# Patient Record
Sex: Female | Born: 1975 | Hispanic: Yes | Marital: Single | Smoking: Current some day smoker
Health system: Southern US, Community
[De-identification: ages and names within clinical notes are randomized; demographics above are authoritative.]

## PROBLEM LIST (undated history)

## (undated) DIAGNOSIS — E119 Type 2 diabetes mellitus without complications: Secondary | ICD-10-CM

---

## 2018-06-26 ENCOUNTER — Encounter (HOSPITAL_COMMUNITY): Payer: Self-pay | Admitting: *Deleted

## 2018-07-22 ENCOUNTER — Other Ambulatory Visit (HOSPITAL_COMMUNITY): Payer: Self-pay | Admitting: *Deleted

## 2018-07-22 DIAGNOSIS — Z1231 Encounter for screening mammogram for malignant neoplasm of breast: Secondary | ICD-10-CM

## 2018-10-09 ENCOUNTER — Ambulatory Visit: Payer: Self-pay

## 2018-10-09 ENCOUNTER — Ambulatory Visit (HOSPITAL_COMMUNITY)
Admission: RE | Admit: 2018-10-09 | Discharge: 2018-10-09 | Disposition: A | Discharge status: Home / Self Care | Payer: Self-pay | Source: Ambulatory Visit | Attending: Obstetrics and Gynecology | Admitting: Obstetrics and Gynecology

## 2018-10-09 ENCOUNTER — Encounter (HOSPITAL_COMMUNITY): Payer: Self-pay

## 2018-10-09 ENCOUNTER — Other Ambulatory Visit (HOSPITAL_COMMUNITY): Payer: Self-pay | Admitting: Obstetrics and Gynecology

## 2018-10-09 VITALS — BP 110/70 | Ht 60.0 in | Wt 146.0 lb

## 2018-10-09 DIAGNOSIS — N6311 Unspecified lump in the right breast, upper outer quadrant: Secondary | ICD-10-CM

## 2018-10-09 DIAGNOSIS — N6320 Unspecified lump in the left breast, unspecified quadrant: Secondary | ICD-10-CM

## 2018-10-09 DIAGNOSIS — Z1239 Encounter for other screening for malignant neoplasm of breast: Secondary | ICD-10-CM

## 2018-10-09 DIAGNOSIS — N6325 Unspecified lump in the left breast, overlapping quadrants: Secondary | ICD-10-CM

## 2018-10-09 DIAGNOSIS — N6321 Unspecified lump in the left breast, upper outer quadrant: Secondary | ICD-10-CM

## 2018-10-09 DIAGNOSIS — N63 Unspecified lump in unspecified breast: Secondary | ICD-10-CM

## 2018-10-09 HISTORY — DX: Type 2 diabetes mellitus without complications: E11.9

## 2018-10-09 NOTE — Patient Instructions (Signed)
Explained breast self awareness with Mal Misty. Patient did not need a Pap smear today due to last Pap smear was 06/21/2018 per patient. Let her know BCCCP will cover Pap smears every 3 years unless has a history of abnormal Pap smears. Referred patient to the [facility] Center of [facility] for a diagnostic mammogram and possible bilateral breast ultrasounds. Appointment scheduled for Tuesday, October 14, 2018 at 1510. Patient aware of appointment and will be there. Let patient know the Breast Center will follow up with her within the next couple weeks with results of mammogram by letter or phone. Discussed smoking cessation with patient. Referred to the [facility] Quitline and gave resources to the free smoking cessation classes at [facility].Mal Misty verbalized understanding.  [place], Kathaleen Maser, RN 2:42 PM

## 2018-10-09 NOTE — Progress Notes (Signed)
No complaints today.   Pap Smear: Pap smear not completed today. Last Pap smear was 06/21/2018 at the [facility] and normal per patient. Per patient has no history of an abnormal Pap smear. No Pap smear results are in Epic.  Physical exam: Breasts Breasts symmetrical. No skin abnormalities bilateral breasts. No nipple retraction bilateral breasts. No nipple discharge bilateral breasts. No lymphadenopathy. Palpated a pea sized lump within the right breast at 11 o'clock 11 cm from the nipple. Palpated two lumps within the left breast at 1 o'clock 8 cm from the nipple and a mobile lump at 3 o'clock 4 cm from the nipple. No complaints of pain or tenderness on exam. Referred patient to the [facility] of [facility] for a diagnostic mammogram and possible bilateral breast ultrasounds. Appointment scheduled for Tuesday, October 14, 2018 at 1510.        Pelvic/Bimanual No Pap smear completed today since last Pap smear was 06/21/2018 per patient. Pap smear not indicated per BCCCP guidelines.   Smoking History: Patient is a current smoker. Discussed smoking cessation with patient. Referred to the [facility] Quitline and gave resources to the free smoking cessation classes at [facility].  Patient Navigation: Patient education provided. Access to services provided for patient through [facility] program. Spanish interpreter provided.   Breast and Cervical Cancer Risk Assessment: Patient has a family history of her mother having breast cancer. Patient has no known genetic mutations or history of radiation treatment to the chest before age 65. Patient has no history of cervical dysplasia, immunocompromised, or DES exposure in-utero.   Risk Assessment    Risk Scores      10/09/2018   Last edited by: Priscille Heidelberg, RN   5-year risk: 0.8 %   Lifetime risk: 11.6 %         Used Spanish interpreter Natale Lay from [place].

## 2018-10-14 ENCOUNTER — Ambulatory Visit
Admission: RE | Admit: 2018-10-14 | Discharge: 2018-10-14 | Disposition: A | Discharge status: Home / Self Care | Payer: PRIVATE HEALTH INSURANCE | Source: Ambulatory Visit | Attending: Obstetrics and Gynecology | Admitting: Obstetrics and Gynecology

## 2018-10-14 ENCOUNTER — Ambulatory Visit
Admission: RE | Admit: 2018-10-14 | Discharge: 2018-10-14 | Disposition: A | Discharge status: Home / Self Care | Payer: Self-pay | Source: Ambulatory Visit | Attending: Obstetrics and Gynecology | Admitting: Obstetrics and Gynecology

## 2018-10-14 DIAGNOSIS — N63 Unspecified lump in unspecified breast: Secondary | ICD-10-CM

## 2018-11-11 ENCOUNTER — Encounter (HOSPITAL_COMMUNITY): Payer: Self-pay | Admitting: *Deleted

## 2020-01-07 IMAGING — MG DIGITAL DIAGNOSTIC BILATERAL MAMMOGRAM WITH TOMO AND CAD
6 of 10 series · 6 of 30 positions shown · non-contrast
Comparison: None.

CLINICAL DATA: 42-year-old female presenting for baseline mammogram
to evaluate bilateral palpable lumps identified on clinical breast
exam. The patient has family history of breast cancer in her mother.

EXAM:
DIGITAL DIAGNOSTIC BILATERAL MAMMOGRAM WITH CAD AND TOMO
ULTRASOUND BILATERAL BREAST

[R CC synth-2D]
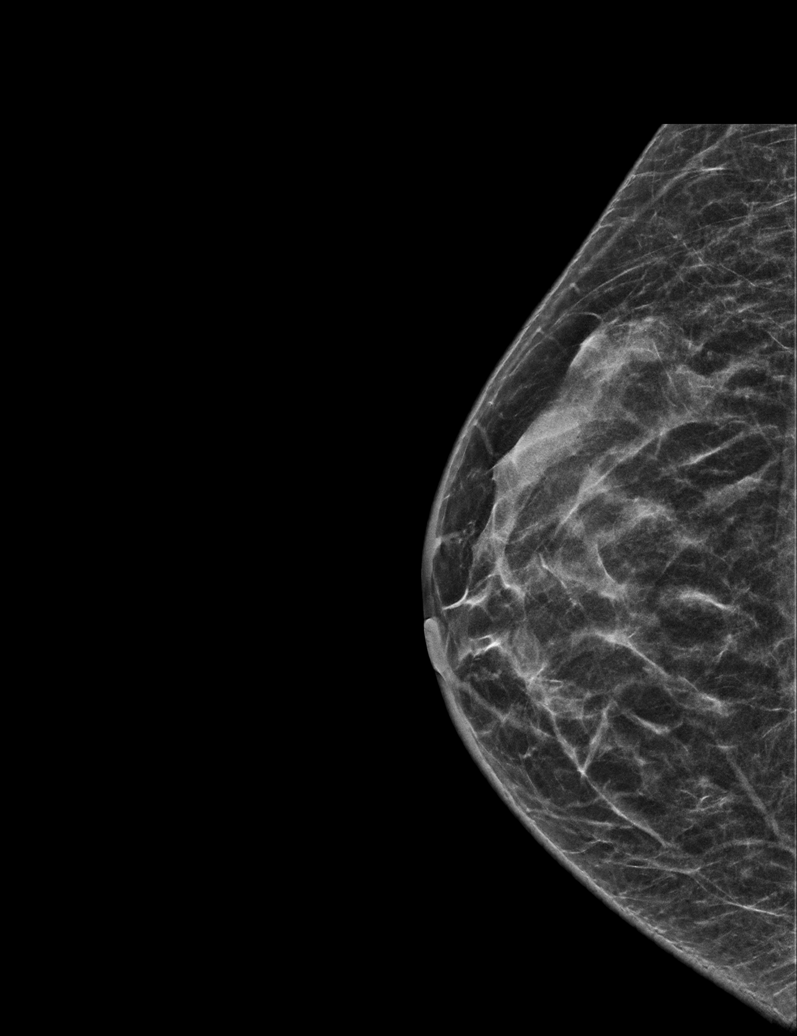

[R MLO synth-2D]
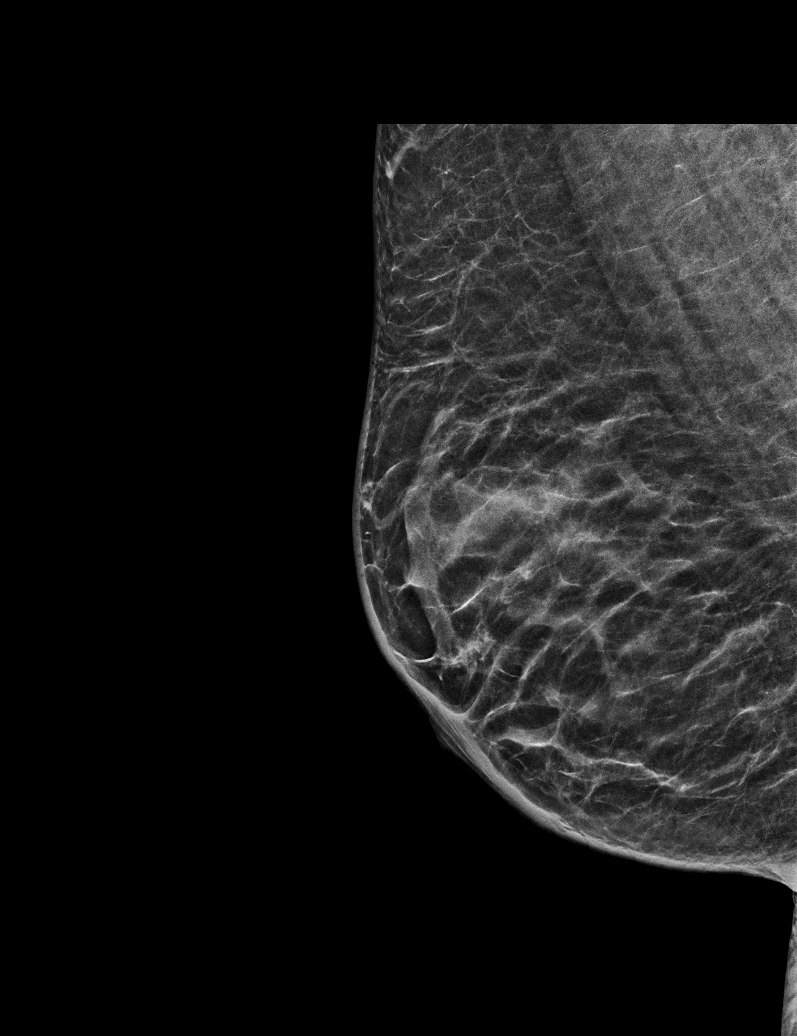

[L MLO synth-2D (1 of 2)]
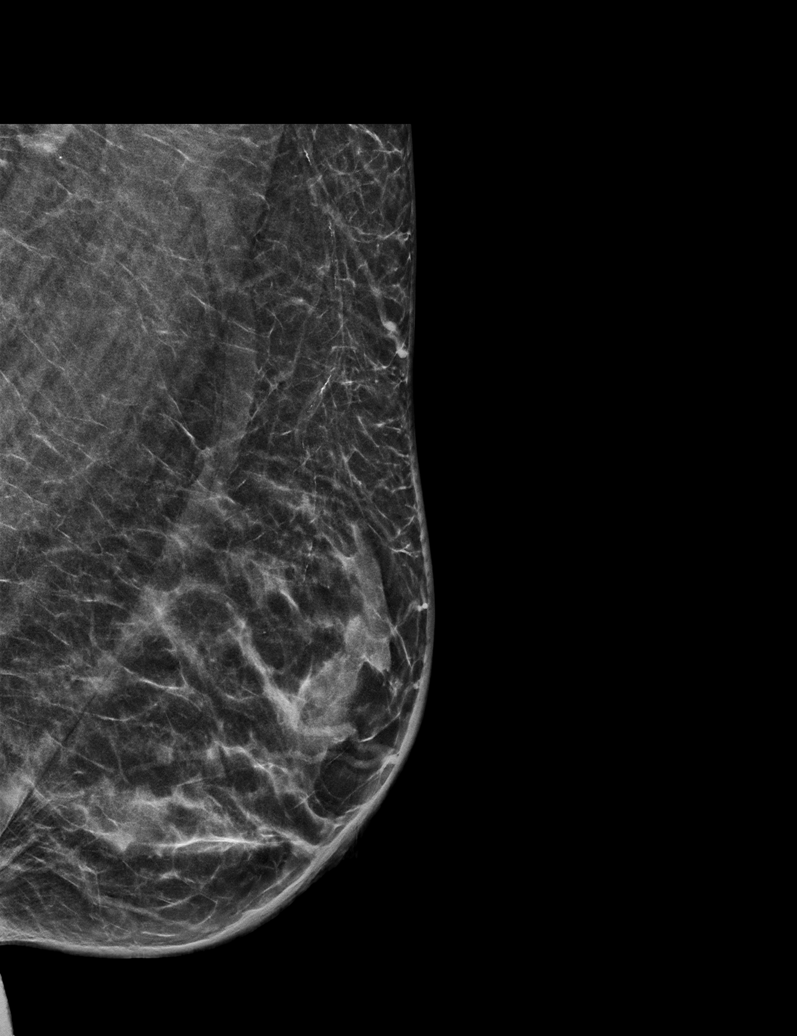

[L CC synth-2D]
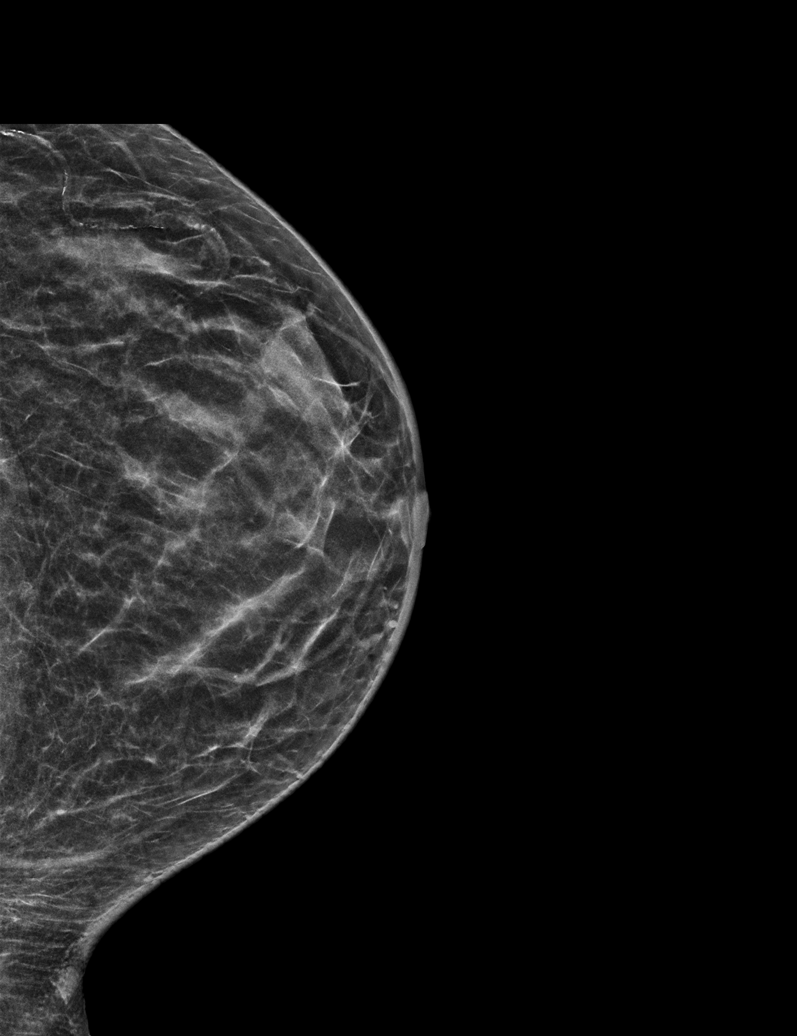

[L MLO synth-2D (2 of 2)]
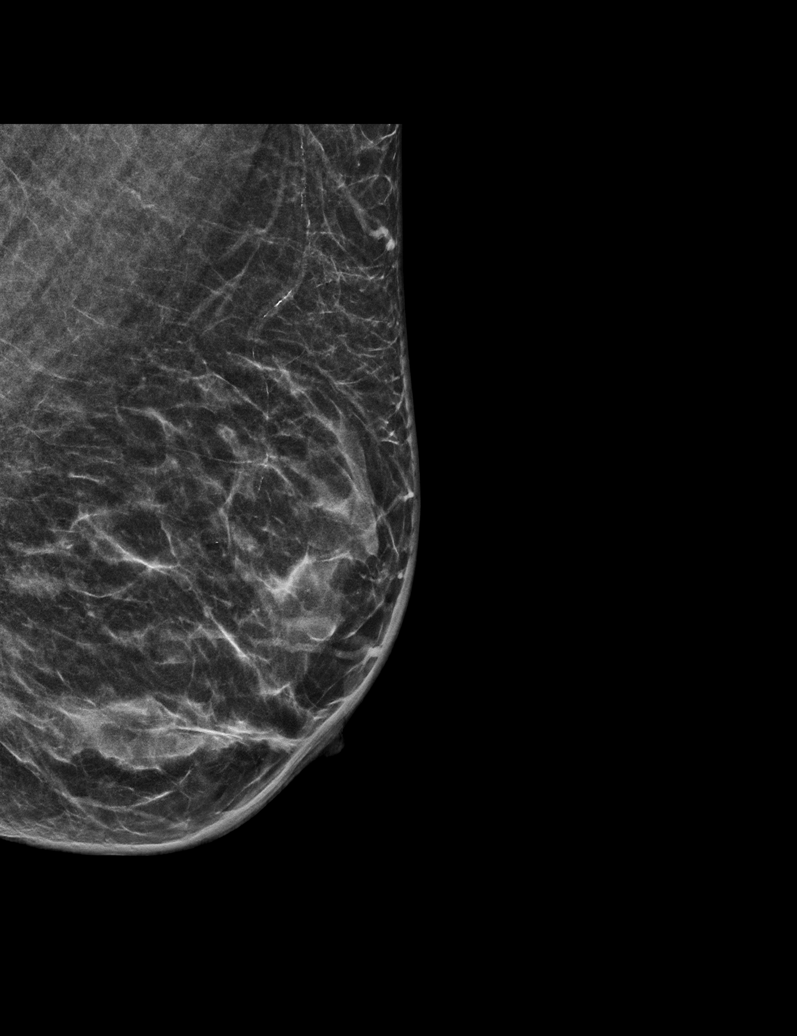

[L MLO tomo · tomo slice 27/54.0]
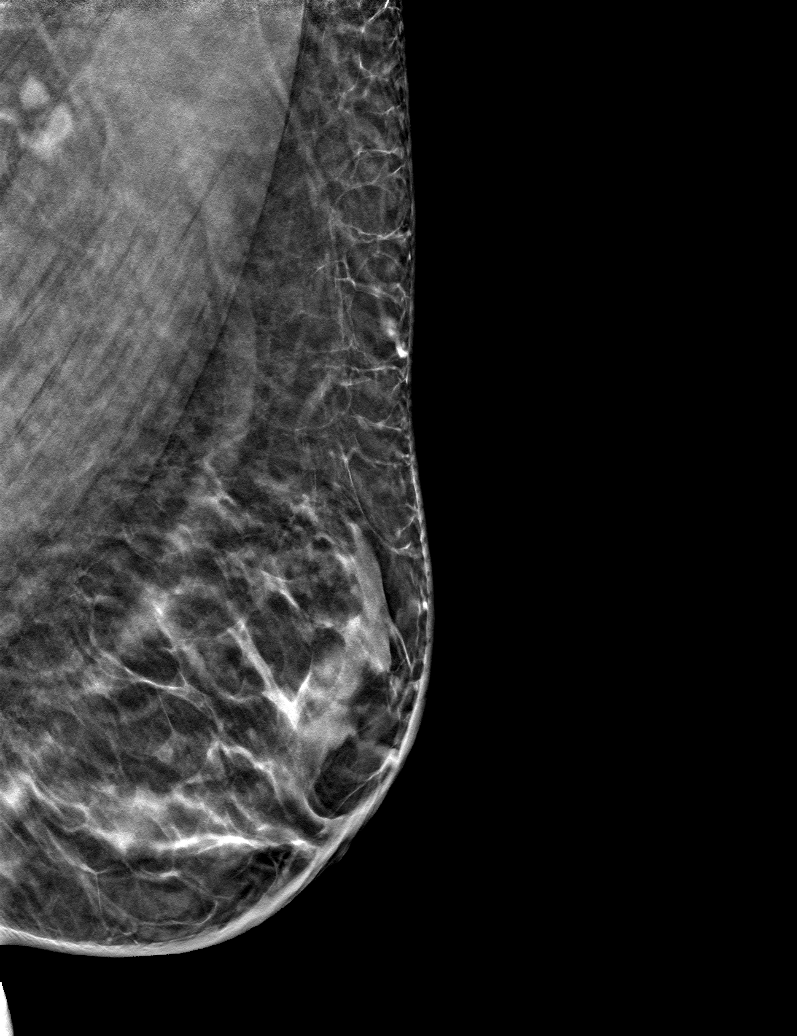

[6 of 30 positions shown; findings below may reference images not displayed]

ACR Breast Density Category c: The breast tissue is heterogeneously
dense, which may obscure small masses.
FINDINGS: No suspicious calcifications, masses or areas of distortion are seen
in the bilateral breasts.

Mammographic images were processed with CAD.

Ultrasound upper-outer quadrant of the right breast in the area of
palpable concern, there is normal fibroglandular tissue. No masses
or suspicious areas of shadowing are identified.

Ultrasound of the upper-outer and lateral left breast demonstrates
normal fibroglandular tissue in the area of palpable concern. No
masses or suspicious areas of shadowing are identified.
IMPRESSION: 1. There are no mammographic or targeted sonographic abnormalities
in the bilateral breasts at the palpable sites of concern.

2.  No mammographic evidence of malignancy in the bilateral breasts.

RECOMMENDATION:
1. Clinical follow-up recommended for palpable areas of concern in
the bilateral breasts. Any further workup should be based on
clinical grounds.

2.  Screening mammogram in one year.(Code:UU-A-O9G)

I have discussed the findings and recommendations with the patient.
Results were also provided in writing at the conclusion of the
visit. If applicable, a reminder letter will be sent to the patient
regarding the next appointment.

BI-RADS CATEGORY  1: Negative.

## 2020-10-03 ENCOUNTER — Other Ambulatory Visit (HOSPITAL_COMMUNITY): Payer: Self-pay | Admitting: Nurse Practitioner

## 2020-10-03 ENCOUNTER — Other Ambulatory Visit: Payer: Self-pay | Admitting: Nurse Practitioner

## 2020-10-03 DIAGNOSIS — Z1231 Encounter for screening mammogram for malignant neoplasm of breast: Secondary | ICD-10-CM

## 2020-10-03 DIAGNOSIS — Z30431 Encounter for routine checking of intrauterine contraceptive device: Secondary | ICD-10-CM

## 2020-10-07 ENCOUNTER — Other Ambulatory Visit: Payer: Self-pay

## 2020-10-07 ENCOUNTER — Ambulatory Visit (HOSPITAL_COMMUNITY)
Admission: RE | Admit: 2020-10-07 | Discharge: 2020-10-07 | Disposition: A | Discharge status: Home / Self Care | Payer: Self-pay | Source: Ambulatory Visit | Attending: Nurse Practitioner | Admitting: Nurse Practitioner

## 2020-10-07 DIAGNOSIS — Z30431 Encounter for routine checking of intrauterine contraceptive device: Secondary | ICD-10-CM | POA: Insufficient documentation

## 2020-10-17 ENCOUNTER — Other Ambulatory Visit: Payer: Self-pay

## 2020-10-17 ENCOUNTER — Ambulatory Visit (HOSPITAL_COMMUNITY): Payer: Self-pay

## 2020-10-17 ENCOUNTER — Encounter: Payer: Self-pay | Admitting: Obstetrics & Gynecology

## 2020-10-17 ENCOUNTER — Ambulatory Visit: Payer: Self-pay | Admitting: Obstetrics & Gynecology

## 2020-10-17 DIAGNOSIS — Z30432 Encounter for removal of intrauterine contraceptive device: Secondary | ICD-10-CM

## 2020-10-17 NOTE — Progress Notes (Signed)
   IUD REMOVAL  Patient name: Becky Reed MRN [id number]  Date of birth: 12-Dec-1975 Subjective Findings:   Becky Reed is a 45 y.o. G72P0 Caucasian female being seen today for removal of a Mirena  IUD. Her IUD was placed at an outside facility a few years ago.  She desires removal because continued pelvic pain. Signed copy of informed consent in chart.  No flowsheet data found.  No LMP recorded. Last pap outside facility- health department. Results were: negative per patient The planned method of family planning is condoms Pertinent History Reviewed:   Reviewed past medical,surgical, social, obstetrical and family history.  Reviewed problem list, medications and allergies. Objective Findings & Procedure:   There were no vitals filed for this visit.There is no height or weight on file to calculate BMI.  No results found for this or any previous visit (from the past 24 hour(s)).   Time out was performed.  A Graves speculum was placed in the vagina.  The cervix was visualized, and the strings not visible. Cervix was cleaned with betadine.  IUD hooked used to locate the strings.  They were then grasped and the Mirena  IUD was easily removed intact without complications. The patient tolerated the procedure well.   Chaperone: Art gallery manager & Plan:   1) Mirena  IUD removal Follow-up prn problems  No orders of the defined types were placed in this encounter.   Follow-up: Return if symptoms worsen or fail to improve.  Sharon Seller CNM, WHNP-BC 10/17/2020 11:18 AM

## 2020-10-19 ENCOUNTER — Inpatient Hospital Stay (HOSPITAL_COMMUNITY): Admission: RE | Admit: 2020-10-19 | Payer: PRIVATE HEALTH INSURANCE | Source: Ambulatory Visit

## 2024-05-27 ENCOUNTER — Other Ambulatory Visit: Payer: Self-pay | Admitting: Obstetrics and Gynecology

## 2024-05-27 DIAGNOSIS — Z1231 Encounter for screening mammogram for malignant neoplasm of breast: Secondary | ICD-10-CM

## 2024-07-28 ENCOUNTER — Encounter: Payer: Self-pay | Admitting: Nurse Practitioner

## 2024-08-20 ENCOUNTER — Ambulatory Visit: Payer: Self-pay | Admitting: *Deleted

## 2024-08-20 ENCOUNTER — Ambulatory Visit
Admission: RE | Admit: 2024-08-20 | Discharge: 2024-08-20 | Disposition: A | Payer: Self-pay | Source: Ambulatory Visit | Attending: Obstetrics and Gynecology | Admitting: Obstetrics and Gynecology

## 2024-08-20 ENCOUNTER — Other Ambulatory Visit: Payer: Self-pay

## 2024-08-20 VITALS — BP 110/73 | Ht 60.0 in | Wt 158.0 lb

## 2024-08-20 DIAGNOSIS — Z1211 Encounter for screening for malignant neoplasm of colon: Secondary | ICD-10-CM

## 2024-08-20 DIAGNOSIS — Z1231 Encounter for screening mammogram for malignant neoplasm of breast: Secondary | ICD-10-CM

## 2024-08-20 DIAGNOSIS — Z01419 Encounter for gynecological examination (general) (routine) without abnormal findings: Secondary | ICD-10-CM

## 2024-08-20 NOTE — Patient Instructions (Signed)
 Explained breast self awareness with Becky Reed. Pap smear completed today. Let her know BCCCP will cover Pap smears and HPV typing every 5 years unless has a history of abnormal Pap smears. Referred patient to the [facility] of [facility] for a screening mammogram on mobile unit. Appointment scheduled Thursday, August 20, 2024 at 1130. Patient aware of appointment and will be there. Let patient know will follow up with her within the next couple weeks with results of Pap smear by letter or phone. Informed patient that the Breast Center will follow up with her within the next couple of weeks with results of her mammogram by letter or phone. Becky Reed verbalized understanding.  Amaan Meyer, Wanda Ship, RN 11:05 AM

## 2024-08-20 NOTE — Progress Notes (Signed)
 Ms. Becky Reed is a 49 y.o. G5P0 female who presents to [facility] clinic today with no complaints.    Pap Smear: Pap smear completed today. Last Pap smear was 10/04/2020 at the [facility] clinic and was normal. Per patient has no history of an abnormal Pap smear. Last Pap smear result is available in Epic.   Physical exam: Breasts Breasts symmetrical. No skin abnormalities bilateral breasts. No nipple retraction bilateral breasts. No nipple discharge bilateral breasts. No lymphadenopathy. No lumps palpated bilateral breasts. No complaints of pain or tenderness on exam.  Pelvic/Bimanual Ext Genitalia No lesions, no swelling and no discharge observed on external genitalia.        Vagina Vagina pink and normal texture. No lesions or discharge observed in vagina.        Cervix Cervix is present. Cervix pink and of normal texture. No discharge observed.    Uterus Uterus is present and palpable. Uterus in normal position and normal size.        Adnexae Bilateral ovaries present and palpable. No tenderness on palpation.         Rectovaginal No rectal exam completed today since patient had no rectal complaints. No skin abnormalities observed on exam.     Smoking History: Patient is a current smoker. Discussed smoking cessation with patient. Referred to the [facility] Quitline.     Patient Navigation: Patient education provided. Access to services provided for patient through [facility] program. Spanish interpreter Becky Reed from [employer] provided. Patient has food insecurities. Escorted patient to the market at the [facility] for groceries.  Colorectal Cancer Screening: Per patient has never had colonoscopy completed. FIT test given to patient to complete. No complaints today.    Breast and Cervical Cancer Risk Assessment: Patient does not have family history of breast cancer, known genetic mutations, or radiation treatment to the chest before age  37. Patient does not have history of cervical dysplasia, immunocompromised, or DES exposure in-utero.  Risk Scores as of Encounter on 08/20/2024     Becky Reed           5-year 1.54%   Lifetime 15.13%   This patient is [place]/Latina but has no documented birth country, so the [place] model used data from [place] patients to calculate their risk score. Document a birth country in the Demographics activity for a more accurate score.         Last calculated by Silas, Ansyi K, CMA on 08/20/2024 at 11:11 AM       A: BCCCP exam with pap smear No complaints.  P: Referred patient to the [facility] for a screening mammogram on mobile unit. Appointment scheduled Thursday, August 20, 2024 at 1130.  Driscilla Wanda SQUIBB, RN 08/20/2024 11:05 AM

## 2024-08-21 LAB — CYTOLOGY - PAP
Comment: NEGATIVE
Diagnosis: NEGATIVE
High risk HPV: NEGATIVE

## 2024-08-24 ENCOUNTER — Ambulatory Visit: Payer: Self-pay
